# Patient Record
Sex: Female | Born: 1985
Health system: Southern US, Community
[De-identification: ages and names within clinical notes are randomized; demographics above are authoritative.]

## PROBLEM LIST (undated history)

## (undated) DIAGNOSIS — Z789 Other specified health status: Secondary | ICD-10-CM

## (undated) HISTORY — DX: Other specified health status: Z78.9

## (undated) HISTORY — PX: NO PAST SURGERIES: SHX2092

## (undated) HISTORY — PX: WISDOM TOOTH EXTRACTION: SHX21

---

## 2009-03-15 ENCOUNTER — Inpatient Hospital Stay (HOSPITAL_COMMUNITY): Admission: AD | Admit: 2009-03-15 | Discharge: 2009-03-17 | Payer: Self-pay | Admitting: Obstetrics & Gynecology

## 2010-05-17 LAB — CBC
HCT: 29.5 % — ABNORMAL LOW (ref 36.0–46.0)
HCT: 35.7 % — ABNORMAL LOW (ref 36.0–46.0)
Hemoglobin: 9.8 g/dL — ABNORMAL LOW (ref 12.0–15.0)
MCV: 91.8 fL (ref 78.0–100.0)
RBC: 3.89 MIL/uL (ref 3.87–5.11)
RDW: 15.3 % (ref 11.5–15.5)
WBC: 15.8 10*3/uL — ABNORMAL HIGH (ref 4.0–10.5)

## 2010-05-17 LAB — RPR: RPR Ser Ql: NONREACTIVE

## 2011-03-01 NOTE — L&D Delivery Note (Addendum)
Delivery Note At 12:13 AM a viable and healthy female was spontaneously delivered, LOA.  APGAR: 9,9; weight 8 lbs 5 oz.   Placenta status: spontaneous, intact.    Anesthesia: Epidural  Episiotomy: None Lacerations: None (Cervix inspected, Uterus explored). Est. Blood Loss (mL): 300  Mom to postpartum.  Baby to nursery-stable.  Jadarious Dobbins D 01/24/2012, 12:24 AM

## 2011-05-24 LAB — OB RESULTS CONSOLE ABO/RH: RH Type: POSITIVE

## 2011-05-24 LAB — OB RESULTS CONSOLE HIV ANTIBODY (ROUTINE TESTING): HIV: NONREACTIVE

## 2011-05-24 LAB — OB RESULTS CONSOLE GC/CHLAMYDIA: Chlamydia: NEGATIVE

## 2011-05-24 LAB — OB RESULTS CONSOLE ANTIBODY SCREEN: Antibody Screen: NEGATIVE

## 2011-05-24 LAB — OB RESULTS CONSOLE RUBELLA ANTIBODY, IGM: Rubella: IMMUNE

## 2012-01-23 ENCOUNTER — Encounter (HOSPITAL_COMMUNITY): Payer: Self-pay | Admitting: *Deleted

## 2012-01-23 ENCOUNTER — Inpatient Hospital Stay (HOSPITAL_COMMUNITY)
Admission: AD | Admit: 2012-01-23 | Discharge: 2012-01-23 | Disposition: A | Discharge status: Home / Self Care | Payer: 59 | Source: Ambulatory Visit | Attending: Obstetrics & Gynecology | Admitting: Obstetrics & Gynecology

## 2012-01-23 ENCOUNTER — Inpatient Hospital Stay (HOSPITAL_COMMUNITY): Payer: 59 | Admitting: Anesthesiology

## 2012-01-23 ENCOUNTER — Inpatient Hospital Stay (HOSPITAL_COMMUNITY)
Admission: AD | Admit: 2012-01-23 | Discharge: 2012-01-25 | DRG: 775 | Disposition: A | Discharge status: Home / Self Care | Payer: 59 | Source: Ambulatory Visit | Attending: Obstetrics & Gynecology | Admitting: Obstetrics & Gynecology

## 2012-01-23 ENCOUNTER — Encounter (HOSPITAL_COMMUNITY): Payer: Self-pay | Admitting: Anesthesiology

## 2012-01-23 DIAGNOSIS — O479 False labor, unspecified: Secondary | ICD-10-CM | POA: Insufficient documentation

## 2012-01-23 HISTORY — DX: Other specified health status: Z78.9

## 2012-01-23 LAB — CBC
HCT: 36.1 % (ref 36.0–46.0)
MCH: 30.5 pg (ref 26.0–34.0)
MCV: 88.7 fL (ref 78.0–100.0)
Platelets: 237 10*3/uL (ref 150–400)
RBC: 4.07 MIL/uL (ref 3.87–5.11)
WBC: 14.7 10*3/uL — ABNORMAL HIGH (ref 4.0–10.5)

## 2012-01-23 MED ORDER — BUTORPHANOL TARTRATE 1 MG/ML IJ SOLN: 1.0000 mg | INTRAMUSCULAR | Status: DC | PRN

## 2012-01-23 MED ORDER — LIDOCAINE HCL (PF) 1 % IJ SOLN
INTRAMUSCULAR | Status: DC | PRN
  Administered 2012-01-23 (×2): 5 mL

## 2012-01-23 MED ORDER — EPHEDRINE 5 MG/ML INJ
10.0000 mg | INTRAVENOUS | Status: DC | PRN
  Filled 2012-01-23: qty 4

## 2012-01-23 MED ORDER — PHENYLEPHRINE 40 MCG/ML (10ML) SYRINGE FOR IV PUSH (FOR BLOOD PRESSURE SUPPORT): 80.0000 ug | PREFILLED_SYRINGE | INTRAVENOUS | Status: DC | PRN

## 2012-01-23 MED ORDER — LACTATED RINGERS IV SOLN: 500.0000 mL | INTRAVENOUS | Status: DC | PRN

## 2012-01-23 MED ORDER — PHENYLEPHRINE 40 MCG/ML (10ML) SYRINGE FOR IV PUSH (FOR BLOOD PRESSURE SUPPORT)
80.0000 ug | PREFILLED_SYRINGE | INTRAVENOUS | Status: DC | PRN
  Filled 2012-01-23: qty 5

## 2012-01-23 MED ORDER — CITRIC ACID-SODIUM CITRATE 334-500 MG/5ML PO SOLN: 30.0000 mL | ORAL | Status: DC | PRN

## 2012-01-23 MED ORDER — FENTANYL 2.5 MCG/ML BUPIVACAINE 1/10 % EPIDURAL INFUSION (WH - ANES)
14.0000 mL/h | INTRAMUSCULAR | Status: DC
  Administered 2012-01-23: 14 mL/h via EPIDURAL
  Filled 2012-01-23: qty 125

## 2012-01-23 MED ORDER — OXYTOCIN BOLUS FROM INFUSION: 500.0000 mL | INTRAVENOUS | Status: DC

## 2012-01-23 MED ORDER — DIPHENHYDRAMINE HCL 50 MG/ML IJ SOLN: 12.5000 mg | INTRAMUSCULAR | Status: DC | PRN

## 2012-01-23 MED ORDER — OXYTOCIN 40 UNITS IN LACTATED RINGERS INFUSION - SIMPLE MED
62.5000 mL/h | INTRAVENOUS | Status: DC
  Administered 2012-01-24: 999 mL/h via INTRAVENOUS
  Filled 2012-01-23: qty 1000

## 2012-01-23 MED ORDER — LACTATED RINGERS IV SOLN
INTRAVENOUS | Status: DC
  Administered 2012-01-23: 20:00:00 via INTRAVENOUS

## 2012-01-23 MED ORDER — OXYCODONE-ACETAMINOPHEN 5-325 MG PO TABS: 1.0000 | ORAL_TABLET | ORAL | Status: DC | PRN

## 2012-01-23 MED ORDER — ACETAMINOPHEN 325 MG PO TABS: 650.0000 mg | ORAL_TABLET | ORAL | Status: DC | PRN

## 2012-01-23 MED ORDER — EPHEDRINE 5 MG/ML INJ: 10.0000 mg | INTRAVENOUS | Status: DC | PRN

## 2012-01-23 MED ORDER — IBUPROFEN 600 MG PO TABS: 600.0000 mg | ORAL_TABLET | Freq: Four times a day (QID) | ORAL | Status: DC | PRN

## 2012-01-23 MED ORDER — LACTATED RINGERS IV SOLN
500.0000 mL | Freq: Once | INTRAVENOUS | Status: AC
  Administered 2012-01-23: 500 mL via INTRAVENOUS

## 2012-01-23 MED ORDER — LIDOCAINE HCL (PF) 1 % IJ SOLN
30.0000 mL | INTRAMUSCULAR | Status: DC | PRN
  Filled 2012-01-23: qty 30

## 2012-01-23 MED ORDER — ONDANSETRON HCL 4 MG/2ML IJ SOLN: 4.0000 mg | Freq: Four times a day (QID) | INTRAMUSCULAR | Status: DC | PRN

## 2012-01-23 NOTE — MAU Note (Signed)
Been contracting all day, getting closer and stronger. Now every 5 min.

## 2012-01-23 NOTE — MAU Note (Signed)
C/o ucs for past 2 days but ucs are stronger today;

## 2012-01-23 NOTE — Anesthesia Procedure Notes (Signed)
Epidural Patient location during procedure: OB Start time: 01/23/2012 8:15 PM  Staffing Anesthesiologist: Brayton Caves R Performed by: anesthesiologist   Preanesthetic Checklist Completed: patient identified, site marked, surgical consent, pre-op evaluation, timeout performed, IV checked, risks and benefits discussed and monitors and equipment checked  Epidural Patient position: sitting Prep: site prepped and draped and DuraPrep Patient monitoring: continuous pulse ox and blood pressure Approach: midline Injection technique: LOR air and LOR saline  Needle:  Needle type: Tuohy  Needle gauge: 17 G Needle length: 9 cm and 9 Needle insertion depth: 5 cm cm Catheter type: closed end flexible Catheter size: 19 Gauge Catheter at skin depth: 10 cm Test dose: negative  Assessment Events: blood not aspirated, injection not painful, no injection resistance, negative IV test and no paresthesia  Additional Notes Patient identified.  Risk benefits discussed including failed block, incomplete pain control, headache, nerve damage, paralysis, blood pressure changes, nausea, vomiting, reactions to medication both toxic or allergic, and postpartum back pain.  Patient expressed understanding and wished to proceed.  All questions were answered.  Sterile technique used throughout procedure and epidural site dressed with sterile barrier dressing. No paresthesia or other complications noted.The patient did not experience any signs of intravascular injection such as tinnitus or metallic taste in mouth nor signs of intrathecal spread such as rapid motor block. Please see nursing notes for vital signs.

## 2012-01-23 NOTE — H&P (Signed)
26 y.o. G2P1001  Estimated Date of Delivery: 01/19/12 admitted at 40/[redacted] weeks gestation in early labor.  Prenatal Transfer Tool  Maternal Diabetes: No Genetic Screening: Declined Maternal Ultrasounds/Referrals: Normal Fetal Ultrasounds or other Referrals:  None Maternal Substance Abuse:  No Significant Maternal Medications:  None Significant Maternal Lab Results: None Other Significant Pregnancy Complications:  None  Afebrile, VSS Heart and Lungs: No active disease Abdomen: soft, gravid, EFW AGA. Cervical exam:  4/80  Impression: Early labor  Plan:  Admit for delivery

## 2012-01-23 NOTE — Anesthesia Preprocedure Evaluation (Signed)

## 2012-01-23 NOTE — MAU Note (Signed)
Notified Dr. Arlyce Dice patient G2P1 [redacted]w[redacted]d contractions every 3-4 minutes, 4/80/-1 intact, GBS negative, uncomfortable desires epidural.

## 2012-01-24 ENCOUNTER — Encounter (HOSPITAL_COMMUNITY): Payer: Self-pay | Admitting: *Deleted

## 2012-01-24 MED ORDER — BENZOCAINE-MENTHOL 20-0.5 % EX AERO: 1.0000 "application " | INHALATION_SPRAY | CUTANEOUS | Status: DC | PRN

## 2012-01-24 MED ORDER — LANOLIN HYDROUS EX OINT: TOPICAL_OINTMENT | CUTANEOUS | Status: DC | PRN

## 2012-01-24 MED ORDER — OXYCODONE-ACETAMINOPHEN 5-325 MG PO TABS
1.0000 | ORAL_TABLET | ORAL | Status: DC | PRN
  Administered 2012-01-24 (×2): 1 via ORAL
  Filled 2012-01-24 (×2): qty 1

## 2012-01-24 MED ORDER — ZOLPIDEM TARTRATE 5 MG PO TABS: 5.0000 mg | ORAL_TABLET | Freq: Every evening | ORAL | Status: DC | PRN

## 2012-01-24 MED ORDER — ONDANSETRON HCL 4 MG/2ML IJ SOLN: 4.0000 mg | INTRAMUSCULAR | Status: DC | PRN

## 2012-01-24 MED ORDER — IBUPROFEN 600 MG PO TABS
600.0000 mg | ORAL_TABLET | Freq: Four times a day (QID) | ORAL | Status: DC
  Administered 2012-01-24 – 2012-01-25 (×6): 600 mg via ORAL
  Filled 2012-01-24 (×6): qty 1

## 2012-01-24 MED ORDER — PRENATAL MULTIVITAMIN CH
1.0000 | ORAL_TABLET | Freq: Every day | ORAL | Status: DC
  Administered 2012-01-24 – 2012-01-25 (×2): 1 via ORAL
  Filled 2012-01-24 (×2): qty 1

## 2012-01-24 MED ORDER — DIBUCAINE 1 % RE OINT: 1.0000 "application " | TOPICAL_OINTMENT | RECTAL | Status: DC | PRN

## 2012-01-24 MED ORDER — ONDANSETRON HCL 4 MG PO TABS: 4.0000 mg | ORAL_TABLET | ORAL | Status: DC | PRN

## 2012-01-24 MED ORDER — TETANUS-DIPHTH-ACELL PERTUSSIS 5-2.5-18.5 LF-MCG/0.5 IM SUSP
0.5000 mL | Freq: Once | INTRAMUSCULAR | Status: AC
  Administered 2012-01-25: 0.5 mL via INTRAMUSCULAR
  Filled 2012-01-24: qty 0.5

## 2012-01-24 MED ORDER — SIMETHICONE 80 MG PO CHEW: 80.0000 mg | CHEWABLE_TABLET | ORAL | Status: DC | PRN

## 2012-01-24 MED ORDER — SENNOSIDES-DOCUSATE SODIUM 8.6-50 MG PO TABS
2.0000 | ORAL_TABLET | Freq: Every day | ORAL | Status: DC
  Administered 2012-01-25: 2 via ORAL

## 2012-01-24 MED ORDER — DIPHENHYDRAMINE HCL 25 MG PO CAPS: 25.0000 mg | ORAL_CAPSULE | Freq: Four times a day (QID) | ORAL | Status: DC | PRN

## 2012-01-24 MED ORDER — WITCH HAZEL-GLYCERIN EX PADS: 1.0000 "application " | MEDICATED_PAD | CUTANEOUS | Status: DC | PRN

## 2012-01-24 NOTE — Progress Notes (Signed)
Post Partum Day 0 Subjective: no complaints, up ad lib, voiding and tolerating PO  Objective: Blood pressure 115/75, pulse 91, temperature 97.9 F (36.6 C), temperature source Oral, resp. rate 18, SpO2 100.00%, unknown if currently breastfeeding.  Physical Exam:  General: alert, cooperative and appears stated age Lochia: appropriate Uterine Fundus: firm   Basename 01/23/12 1930  HGB 12.4  HCT 36.1    Assessment/Plan: Routine PP care   LOS: 1 day   Dana Debo H. 01/24/2012, 10:20 AM

## 2012-01-24 NOTE — Anesthesia Postprocedure Evaluation (Signed)
  Anesthesia Post-op Note  Patient: Teresa Bean  Procedure(s) Performed: * No procedures listed *  Patient Location: Mother/Baby  Anesthesia Type:Epidural  Level of Consciousness: awake, alert  and oriented  Airway and Oxygen Therapy: Patient Spontanous Breathing  Post-op Pain: none  Post-op Assessment: Post-op Vital signs reviewed, Patient's Cardiovascular Status Stable, No headache, No backache, No residual numbness and No residual motor weakness  Post-op Vital Signs: Reviewed and stable  Complications: No apparent anesthesia complications

## 2012-01-25 LAB — CBC
HCT: 31.1 % — ABNORMAL LOW (ref 36.0–46.0)
MCH: 29.6 pg (ref 26.0–34.0)
MCV: 90.1 fL (ref 78.0–100.0)
Platelets: 189 10*3/uL (ref 150–400)
RBC: 3.45 MIL/uL — ABNORMAL LOW (ref 3.87–5.11)
RDW: 14.9 % (ref 11.5–15.5)

## 2012-01-25 NOTE — Discharge Summary (Signed)
Obstetric Discharge Summary Reason for Admission: onset of labor Prenatal Procedures: none Intrapartum Procedures: spontaneous vaginal delivery Postpartum Procedures: none Complications-Operative and Postpartum: none Hemoglobin  Date Value Range Status  01/25/2012 10.2* 12.0 - 15.0 g/dL Final     DELTA CHECK NOTED     REPEATED TO VERIFY     HCT  Date Value Range Status  01/25/2012 31.1* 36.0 - 46.0 % Final     Discharge Diagnoses: Term Pregnancy-delivered  Discharge Information: Date: 01/25/2012 Activity: pelvic rest Diet: routine Medications: Ibuprofen Condition: stable Instructions: refer to practice specific booklet Discharge to: home Follow-up Information    Follow up with Mickel Baas, MD. Schedule an appointment as soon as possible for a visit in 4 weeks.   Contact information:   [address] [place] [address] [phone]          Newborn Data: Live born female  Birth Weight: 8 lb 4.8 oz (3765 g) APGAR: 8, 9  Home with mother.  Byron Tipping A 01/25/2012, 8:48 AM

## 2012-01-25 NOTE — Progress Notes (Addendum)
Patient is eating, ambulating, voiding.  Pain control is good.  Filed Vitals:   01/24/12 0614 01/24/12 1435 01/24/12 2127 01/25/12 0555  BP: 115/75 114/75 109/73 107/67  Pulse: 91 84 75 86  Temp: 97.9 F (36.6 C) 98 F (36.7 C) 97.9 F (36.6 C) 98.2 F (36.8 C)  TempSrc: Oral Oral Oral Oral  Resp: 18 18 18 18   SpO2: 100%       Fundus firm Perineum without swelling.  Lab Results  Component Value Date   WBC 13.4* 01/25/2012   HGB 10.2* 01/25/2012   HCT 31.1* 01/25/2012   MCV 90.1 01/25/2012   PLT 189 01/25/2012    --/--/O POS (11/25 1930)/RI  A/P Post partum day 1.  Routine care. Pt desires d/c today.    Glennie Rodda A

## 2012-02-07 ENCOUNTER — Telehealth (HOSPITAL_COMMUNITY): Payer: Self-pay | Admitting: *Deleted

## 2012-02-07 NOTE — Telephone Encounter (Signed)
Resolve episode 

## 2013-12-30 ENCOUNTER — Encounter (HOSPITAL_COMMUNITY): Payer: Self-pay | Admitting: *Deleted

## 2015-05-01 MED FILL — metroNIDAZOLE 500 MG TABS: 500 | 7 days supply | Qty: 14 | Fill #0

## 2015-06-03 DIAGNOSIS — Z01419 Encounter for gynecological examination (general) (routine) without abnormal findings: Secondary | ICD-10-CM | POA: Diagnosis not present

## 2015-06-03 DIAGNOSIS — Z124 Encounter for screening for malignant neoplasm of cervix: Secondary | ICD-10-CM | POA: Diagnosis not present

## 2015-06-03 DIAGNOSIS — Z6829 Body mass index (BMI) 29.0-29.9, adult: Secondary | ICD-10-CM | POA: Diagnosis not present

## 2015-07-02 MED FILL — NUVARING VAGINAL RING: 0.12-0.015 | 84 days supply | Qty: 3 | Fill #0

## 2015-09-09 MED FILL — NUVARING VAGINAL RING: 0.12-0.015 | 28 days supply | Qty: 1 | Fill #1

## 2015-10-27 MED FILL — NUVARING VAGINAL RING: 0.12-0.015 | 84 days supply | Qty: 3 | Fill #0

## 2016-03-07 MED FILL — NUVARING VAGINAL RING: 0.12-0.015 | 84 days supply | Qty: 3 | Fill #1

## 2016-10-19 MED FILL — NUVARING VAGINAL RING: 0.12-0.015 | 84 days supply | Qty: 3 | Fill #0

## 2017-02-27 MED FILL — NUVARING VAGINAL RING: 0.12-0.015 | 84 days supply | Qty: 3 | Fill #1

## 2017-06-08 DIAGNOSIS — Z124 Encounter for screening for malignant neoplasm of cervix: Secondary | ICD-10-CM | POA: Diagnosis not present

## 2017-06-08 DIAGNOSIS — Z113 Encounter for screening for infections with a predominantly sexual mode of transmission: Secondary | ICD-10-CM | POA: Diagnosis not present

## 2017-06-08 DIAGNOSIS — Z01419 Encounter for gynecological examination (general) (routine) without abnormal findings: Secondary | ICD-10-CM | POA: Diagnosis not present

## 2017-07-28 DIAGNOSIS — H5213 Myopia, bilateral: Secondary | ICD-10-CM | POA: Diagnosis not present

## 2017-07-28 DIAGNOSIS — H52223 Regular astigmatism, bilateral: Secondary | ICD-10-CM | POA: Diagnosis not present

## 2018-08-17 DIAGNOSIS — Z3040 Encounter for surveillance of contraceptives, unspecified: Secondary | ICD-10-CM | POA: Diagnosis not present

## 2018-08-23 DIAGNOSIS — H5213 Myopia, bilateral: Secondary | ICD-10-CM | POA: Diagnosis not present

## 2018-09-07 MED FILL — ETONOGESTREL-ETHINYL ESTRAD: 0.12-0.015 | 84 days supply | Qty: 3 | Fill #0

## 2018-11-22 DIAGNOSIS — Z0000 Encounter for general adult medical examination without abnormal findings: Secondary | ICD-10-CM | POA: Diagnosis not present

## 2018-11-22 DIAGNOSIS — Z131 Encounter for screening for diabetes mellitus: Secondary | ICD-10-CM | POA: Diagnosis not present

## 2018-11-22 DIAGNOSIS — Z130 Encounter for screening for diseases of the blood and blood-forming organs and certain disorders involving the immune mechanism: Secondary | ICD-10-CM | POA: Diagnosis not present

## 2018-11-22 DIAGNOSIS — Z01419 Encounter for gynecological examination (general) (routine) without abnormal findings: Secondary | ICD-10-CM | POA: Diagnosis not present

## 2018-11-22 DIAGNOSIS — Z6830 Body mass index (BMI) 30.0-30.9, adult: Secondary | ICD-10-CM | POA: Diagnosis not present

## 2018-11-22 DIAGNOSIS — Z13220 Encounter for screening for lipoid disorders: Secondary | ICD-10-CM | POA: Diagnosis not present

## 2018-11-22 DIAGNOSIS — N898 Other specified noninflammatory disorders of vagina: Secondary | ICD-10-CM | POA: Diagnosis not present

## 2018-11-22 DIAGNOSIS — Z1159 Encounter for screening for other viral diseases: Secondary | ICD-10-CM | POA: Diagnosis not present

## 2018-11-22 DIAGNOSIS — Z113 Encounter for screening for infections with a predominantly sexual mode of transmission: Secondary | ICD-10-CM | POA: Diagnosis not present

## 2018-11-22 DIAGNOSIS — Z1329 Encounter for screening for other suspected endocrine disorder: Secondary | ICD-10-CM | POA: Diagnosis not present

## 2018-11-22 DIAGNOSIS — Z1151 Encounter for screening for human papillomavirus (HPV): Secondary | ICD-10-CM | POA: Diagnosis not present

## 2018-11-22 DIAGNOSIS — Z118 Encounter for screening for other infectious and parasitic diseases: Secondary | ICD-10-CM | POA: Diagnosis not present

## 2018-11-22 DIAGNOSIS — Z114 Encounter for screening for human immunodeficiency virus [HIV]: Secondary | ICD-10-CM | POA: Diagnosis not present

## 2018-11-22 MED FILL — FLUCONAZOLE 150 MG TABS: 150 | 1 days supply | Qty: 1 | Fill #0

## 2018-11-22 MED FILL — NYSTATIN-TRIAMCINOLONE CRM: 100000-0.1 | 10 days supply | Qty: 30 | Fill #0

## 2018-12-05 ENCOUNTER — Ambulatory Visit (INDEPENDENT_AMBULATORY_CARE_PROVIDER_SITE_OTHER): Payer: 59 | Admitting: Family Medicine

## 2018-12-05 ENCOUNTER — Other Ambulatory Visit: Payer: Self-pay

## 2018-12-05 ENCOUNTER — Encounter: Payer: Self-pay | Admitting: Family Medicine

## 2018-12-05 VITALS — BP 113/75 | HR 83 | Temp 99.3°F | Resp 17 | Ht 64.0 in | Wt 174.8 lb

## 2018-12-05 DIAGNOSIS — Z131 Encounter for screening for diabetes mellitus: Secondary | ICD-10-CM | POA: Diagnosis not present

## 2018-12-05 DIAGNOSIS — Z30016 Encounter for initial prescription of transdermal patch hormonal contraceptive device: Secondary | ICD-10-CM

## 2018-12-05 DIAGNOSIS — Z0000 Encounter for general adult medical examination without abnormal findings: Secondary | ICD-10-CM

## 2018-12-05 DIAGNOSIS — Z13220 Encounter for screening for lipoid disorders: Secondary | ICD-10-CM | POA: Diagnosis not present

## 2018-12-05 DIAGNOSIS — Z1329 Encounter for screening for other suspected endocrine disorder: Secondary | ICD-10-CM

## 2018-12-05 DIAGNOSIS — Z23 Encounter for immunization: Secondary | ICD-10-CM

## 2018-12-05 DIAGNOSIS — Z136 Encounter for screening for cardiovascular disorders: Secondary | ICD-10-CM

## 2018-12-05 LAB — POCT URINE PREGNANCY: Preg Test, Ur: NEGATIVE

## 2018-12-05 MED ORDER — XULANE 150-35 MCG/24HR TD PTWK: 1.0000 | MEDICATED_PATCH | TRANSDERMAL | 12 refills | Status: DC

## 2018-12-05 MED FILL — XULANE PATCH: 150-35 | 28 days supply | Qty: 3 | Fill #0

## 2018-12-05 NOTE — Patient Instructions (Signed)
° ° ° °  If you have lab work done today you will be contacted with your lab results within the next 2 weeks.  If you have not heard from us then please contact us. The fastest way to get your results is to register for My Chart. ° ° °IF you received an x-ray today, you will receive an invoice from [facility]. Please contact [facility] at [phone] with questions or concerns regarding your invoice.  ° °IF you received labwork today, you will receive an invoice from [facility]. Please contact [facility] at [phone] with questions or concerns regarding your invoice.  ° °Our billing staff will not be able to assist you with questions regarding bills from these companies. ° °You will be contacted with the lab results as soon as they are available. The fastest way to get your results is to activate your My Chart account. Instructions are located on the last page of this paperwork. If you have not heard from us regarding the results in 2 weeks, please contact this office. °  ° ° ° °

## 2018-12-05 NOTE — Progress Notes (Signed)
New Patient Office Visit  Subjective:  Patient ID: Teresa Bean, female    DOB: 1985-08-11  Age: 33 y.o. MRN: [id number]  CC:  Chief Complaint  Patient presents with  . New Patient (Initial Visit)    establish care    HPI Teresa Bean presents for   She reports that she needs a new version of contraception Her insurance will not cover the brand nuvaring and will only cover the generic.  She has been on the nuvaring for 9 years. The generic makes her nauseous. Dr. Harvie Bridge gave her a prescription of nuvaring but insurance won't cover. She is currently abstinent.  No history of migraines, liver disease, hypertension,   G2P2002  She does not desire any pregnancy    Past Medical History:  Diagnosis Date  . No pertinent past medical history     Past Surgical History:  Procedure Laterality Date  . NO PAST SURGERIES      Family History  Problem Relation Age of Onset  . Other Neg Hx     Social History   Socioeconomic History  . Marital status: Single    Spouse name: Not on file  . Number of children: Not on file  . Years of education: Not on file  . Highest education level: Not on file  Occupational History  . Not on file  Social Needs  . Financial resource strain: Not on file  . Food insecurity    Worry: Not on file    Inability: Not on file  . Transportation needs    Medical: Not on file    Non-medical: Not on file  Tobacco Use  . Smoking status: Never Smoker  . Smokeless tobacco: Never Used  Substance and Sexual Activity  . Alcohol use: No  . Drug use: No  . Sexual activity: Yes  Lifestyle  . Physical activity    Days per week: Not on file    Minutes per session: Not on file  . Stress: Not on file  Relationships  . Social Herbalist on phone: Not on file    Gets together: Not on file    Attends religious service: Not on file    Active member of club or organization: Not on file    Attends meetings of clubs or  organizations: Not on file    Relationship status: Not on file  . Intimate partner violence    Fear of current or ex partner: Not on file    Emotionally abused: Not on file    Physically abused: Not on file    Forced sexual activity: Not on file  Other Topics Concern  . Not on file  Social History Narrative  . Not on file    ROS Review of Systems Review of Systems  Constitutional: Negative for activity change, appetite change, chills and fever.  HENT: Negative for congestion, nosebleeds, trouble swallowing and voice change.   Respiratory: Negative for cough, shortness of breath and wheezing.   Gastrointestinal: Negative for diarrhea, nausea and vomiting.  Genitourinary: Negative for difficulty urinating, dysuria, flank pain and hematuria.  Musculoskeletal: Negative for back pain, joint swelling and neck pain.  Neurological: Negative for dizziness, speech difficulty, light-headedness and numbness.  See HPI. All other review of systems negative.   Objective:   Today's Vitals: BP 113/75 (BP Location: Right Arm, Patient Position: Sitting, Cuff Size: Normal)   Pulse 83   Temp 99.3 F (37.4 C) (Oral)   Resp 17  Ht 5\' 4"  (1.626 m)   Wt 174 lb 12.8 oz (79.3 kg)   SpO2 99%   BMI 30.00 kg/m   Physical Exam Constitutional:      Appearance: Normal appearance. She is normal weight.  HENT:     Head: Normocephalic and atraumatic.     Nose: Nose normal.     Mouth/Throat:     Pharynx: No oropharyngeal exudate.  Eyes:     Extraocular Movements: Extraocular movements intact.     Conjunctiva/sclera: Conjunctivae normal.  Neck:     Musculoskeletal: Normal range of motion and neck supple. No neck rigidity.  Cardiovascular:     Rate and Rhythm: Normal rate and regular rhythm.     Pulses: Normal pulses.     Heart sounds: No murmur.  Pulmonary:     Effort: Pulmonary effort is normal. No respiratory distress.     Breath sounds: Normal breath sounds. No stridor. No wheezing, rhonchi or  rales.  Chest:     Chest wall: No tenderness.  Abdominal:     General: Abdomen is flat. Bowel sounds are normal. There is no distension.     Palpations: Abdomen is soft. There is no mass.     Tenderness: There is no abdominal tenderness. There is no guarding or rebound.     Hernia: No hernia is present.  Skin:    General: Skin is warm.     Capillary Refill: Capillary refill takes less than 2 seconds.  Neurological:     General: No focal deficit present.     Mental Status: She is alert and oriented to person, place, and time.  Psychiatric:        Mood and Affect: Mood normal.        Behavior: Behavior normal.        Thought Content: Thought content normal.        Judgment: Judgment normal.       Assessment & Plan:   Problem List Items Addressed This Visit    None    Visit Diagnoses    Routine health maintenance    -  Primary Women's Health Maintenance Plan Advised monthly breast exam and annual mammogram Advised dental exam every six months Discussed stress management Discussed pap smear screening guidelines     Need for prophylactic vaccination and inoculation against influenza       Encounter for initial prescription of transdermal patch hormonal contraceptive device     Discussed options for contraception, discussed frequency of use and common side effects Will use patch   Relevant Medications   norelgestromin-ethinyl estradiol Marilu Favre) 150-35 MCG/24HR transdermal patch   Other Relevant Orders   POCT urine pregnancy (Completed)   Screening for thyroid disorder       Relevant Orders   TSH   Encounter for lipid screening for cardiovascular disease       Relevant Orders   Lipid panel   Screening for diabetes mellitus          Outpatient Encounter Medications as of 12/05/2018  Medication Sig  . acetaminophen (TYLENOL) 500 MG tablet Take 500 mg by mouth daily as needed. For headaches/pain  . calcium carbonate (TUMS - DOSED IN MG ELEMENTAL CALCIUM) 500 MG  chewable tablet Chew 2 tablets by mouth at bedtime as needed. For heartburn  . norelgestromin-ethinyl estradiol Marilu Favre) 150-35 MCG/24HR transdermal patch Place 1 patch onto the skin once a week. Dispense as Marilu Favre.  . Prenatal Vit-Fe Fumarate-FA (PRENATAL MULTIVITAMIN) TABS Take 1 tablet by mouth daily.  No facility-administered encounter medications on file as of 12/05/2018.     Follow-up: No follow-ups on file.   Forrest Moron, MD

## 2018-12-06 LAB — LIPID PANEL
Chol/HDL Ratio: 3 ratio (ref 0.0–4.4)
Cholesterol, Total: 204 mg/dL — ABNORMAL HIGH (ref 100–199)
HDL: 68 mg/dL (ref >39)
LDL Chol Calc (NIH): 128 mg/dL — ABNORMAL HIGH (ref 0–99)
Triglycerides: 43 mg/dL (ref 0–149)
VLDL Cholesterol Cal: 8 mg/dL (ref 5–40)

## 2018-12-06 LAB — TSH: TSH: 0.97 u[IU]/mL (ref 0.450–4.500)

## 2018-12-13 ENCOUNTER — Encounter: Payer: Self-pay | Admitting: Radiology

## 2019-02-18 ENCOUNTER — Other Ambulatory Visit: Payer: Self-pay | Admitting: Cardiology

## 2019-02-18 DIAGNOSIS — Z20828 Contact with and (suspected) exposure to other viral communicable diseases: Secondary | ICD-10-CM | POA: Diagnosis not present

## 2019-02-18 DIAGNOSIS — Z20822 Contact with and (suspected) exposure to covid-19: Secondary | ICD-10-CM

## 2019-02-19 LAB — NOVEL CORONAVIRUS, NAA: SARS-CoV-2, NAA: NOT DETECTED

## 2019-08-23 DIAGNOSIS — H52223 Regular astigmatism, bilateral: Secondary | ICD-10-CM | POA: Diagnosis not present

## 2019-08-23 DIAGNOSIS — H04123 Dry eye syndrome of bilateral lacrimal glands: Secondary | ICD-10-CM | POA: Diagnosis not present

## 2019-08-23 DIAGNOSIS — H5213 Myopia, bilateral: Secondary | ICD-10-CM | POA: Diagnosis not present

## 2019-09-16 ENCOUNTER — Other Ambulatory Visit: Payer: Self-pay

## 2019-09-16 ENCOUNTER — Ambulatory Visit: Payer: 59 | Attending: Internal Medicine

## 2019-09-16 DIAGNOSIS — Z20822 Contact with and (suspected) exposure to covid-19: Secondary | ICD-10-CM | POA: Insufficient documentation

## 2019-09-17 LAB — SARS-COV-2, NAA 2 DAY TAT

## 2019-09-17 LAB — NOVEL CORONAVIRUS, NAA: SARS-CoV-2, NAA: NOT DETECTED

## 2020-05-19 DIAGNOSIS — Z309 Encounter for contraceptive management, unspecified: Secondary | ICD-10-CM | POA: Diagnosis not present

## 2020-05-19 DIAGNOSIS — Z01419 Encounter for gynecological examination (general) (routine) without abnormal findings: Secondary | ICD-10-CM | POA: Diagnosis not present

## 2020-05-21 DIAGNOSIS — Z131 Encounter for screening for diabetes mellitus: Secondary | ICD-10-CM | POA: Diagnosis not present

## 2020-06-03 ENCOUNTER — Other Ambulatory Visit (HOSPITAL_BASED_OUTPATIENT_CLINIC_OR_DEPARTMENT_OTHER): Payer: Self-pay

## 2020-06-03 MED ORDER — IBUPROFEN 800 MG PO TABS
ORAL_TABLET | ORAL | 0 refills | Status: DC
Start: 2020-06-03 — End: 2021-04-08
  Filled 2020-06-03: qty 21, 7d supply, fill #0

## 2020-06-11 DIAGNOSIS — Z30430 Encounter for insertion of intrauterine contraceptive device: Secondary | ICD-10-CM | POA: Diagnosis not present

## 2020-06-11 DIAGNOSIS — Z309 Encounter for contraceptive management, unspecified: Secondary | ICD-10-CM | POA: Diagnosis not present

## 2020-07-09 DIAGNOSIS — N930 Postcoital and contact bleeding: Secondary | ICD-10-CM | POA: Diagnosis not present

## 2020-07-09 DIAGNOSIS — Z1159 Encounter for screening for other viral diseases: Secondary | ICD-10-CM | POA: Diagnosis not present

## 2020-07-09 DIAGNOSIS — Z113 Encounter for screening for infections with a predominantly sexual mode of transmission: Secondary | ICD-10-CM | POA: Diagnosis not present

## 2020-07-09 DIAGNOSIS — Z114 Encounter for screening for human immunodeficiency virus [HIV]: Secondary | ICD-10-CM | POA: Diagnosis not present

## 2020-07-09 DIAGNOSIS — Z30431 Encounter for routine checking of intrauterine contraceptive device: Secondary | ICD-10-CM | POA: Diagnosis not present

## 2020-07-21 ENCOUNTER — Ambulatory Visit: Payer: 59 | Attending: Internal Medicine

## 2020-07-21 ENCOUNTER — Other Ambulatory Visit (HOSPITAL_BASED_OUTPATIENT_CLINIC_OR_DEPARTMENT_OTHER): Payer: Self-pay

## 2020-07-21 DIAGNOSIS — N760 Acute vaginitis: Secondary | ICD-10-CM | POA: Diagnosis not present

## 2020-07-21 DIAGNOSIS — Z113 Encounter for screening for infections with a predominantly sexual mode of transmission: Secondary | ICD-10-CM | POA: Diagnosis not present

## 2020-07-21 DIAGNOSIS — N898 Other specified noninflammatory disorders of vagina: Secondary | ICD-10-CM | POA: Diagnosis not present

## 2020-07-21 DIAGNOSIS — Z23 Encounter for immunization: Secondary | ICD-10-CM

## 2020-07-21 MED ORDER — FLUCONAZOLE 150 MG PO TABS
ORAL_TABLET | ORAL | 1 refills | Status: DC
  Filled 2020-07-21: qty 1, 1d supply, fill #0

## 2020-07-21 MED ORDER — METRONIDAZOLE 500 MG PO TABS
ORAL_TABLET | ORAL | 1 refills | Status: DC
  Filled 2020-07-21: qty 14, 7d supply, fill #0
  Filled 2020-09-07: qty 14, 7d supply, fill #1

## 2020-07-21 NOTE — Progress Notes (Signed)
   Covid-19 Vaccination Clinic  Name:  Teresa Bean    MRN: [id number] DOB: December 25, 1985  07/21/2020  [name] was observed post Covid-19 immunization for 15 minutes without incident. She was provided with Vaccine Information Sheet and instruction to access the V-Safe system.   [name] was instructed to call 911 with any severe reactions post vaccine: Marland Kitchen Difficulty breathing  . Swelling of face and throat  . A fast heartbeat  . A bad rash all over body  . Dizziness and weakness   Immunizations Administered    Name Date Dose VIS Date Route   PFIZER Comrnaty(Gray TOP) Covid-19 Vaccine 07/21/2020  3:08 PM 0.3 mL 02/06/2020 Intramuscular   Manufacturer: [employer]   Lot: [id number]   NDC: [phone]

## 2020-07-22 ENCOUNTER — Ambulatory Visit: Payer: 59

## 2020-07-28 ENCOUNTER — Other Ambulatory Visit (HOSPITAL_BASED_OUTPATIENT_CLINIC_OR_DEPARTMENT_OTHER): Payer: Self-pay

## 2020-07-28 MED ORDER — PFIZER-BIONT COVID-19 VAC-TRIS 30 MCG/0.3ML IM SUSP
INTRAMUSCULAR | 0 refills | Status: AC
  Filled 2020-07-28: qty 0.3, 1d supply, fill #0

## 2020-09-07 ENCOUNTER — Other Ambulatory Visit (HOSPITAL_BASED_OUTPATIENT_CLINIC_OR_DEPARTMENT_OTHER): Payer: Self-pay

## 2021-02-05 ENCOUNTER — Other Ambulatory Visit: Payer: Self-pay

## 2021-02-05 ENCOUNTER — Ambulatory Visit
Admission: EM | Admit: 2021-02-05 | Discharge: 2021-02-05 | Disposition: A | Discharge status: Home / Self Care | Payer: 59

## 2021-02-05 DIAGNOSIS — R6883 Chills (without fever): Secondary | ICD-10-CM | POA: Diagnosis not present

## 2021-02-05 DIAGNOSIS — R051 Acute cough: Secondary | ICD-10-CM

## 2021-02-05 DIAGNOSIS — R52 Pain, unspecified: Secondary | ICD-10-CM

## 2021-02-05 DIAGNOSIS — U071 COVID-19: Secondary | ICD-10-CM

## 2021-02-05 NOTE — ED Triage Notes (Signed)
Pt reports testing positive for Covid on Tuesday (at home test) and states she has had a cough, body aches and chills. Patient requesting to have Covid test done today.

## 2021-02-05 NOTE — ED Provider Notes (Signed)
[facility]    CSN: [id number] Arrival date & time: 02/05/21  0906    HISTORY  No chief complaint on file.  HPI Teresa Bean is a 35 y.o. female. Pt reports testing positive for Covid on Tuesday (at home test) and states she has had a cough, body aches and chills. Patient requesting to have Covid test done today.  Patient states she is overall feeling better at this time.  The history is provided by the patient.  Past Medical History:  Diagnosis Date   No pertinent past medical history    There are no problems to display for this patient.  Past Surgical History:  Procedure Laterality Date   NO PAST SURGERIES     OB History     Gravida  2   Para  2   Term  2   Preterm      AB      Living  2      SAB      IAB      Ectopic      Multiple      Live Births  1          Home Medications    Prior to Admission medications   Medication Sig Start Date End Date Taking? Authorizing Provider  acetaminophen (TYLENOL) 500 MG tablet Take 500 mg by mouth daily as needed. For headaches/pain    [provider]  calcium carbonate (TUMS - DOSED IN MG ELEMENTAL CALCIUM) 500 MG chewable tablet Chew 2 tablets by mouth at bedtime as needed. For heartburn    [provider]  COVID-19 mRNA Vac-TriS, Pfizer, (PFIZER-BIONT COVID-19 VAC-TRIS) SUSP injection Inject into the muscle. 07/21/20   Carlyle Basques, MD  ibuprofen (ADVIL) 800 MG tablet TAKE 1 TABLET BY MOUTH EVERY 8 HOURS AS NEEDED FOR PAIN WITH FOOD 06/03/20     norelgestromin-ethinyl estradiol Marilu Favre) 150-35 MCG/24HR transdermal patch Place 1 patch onto the skin once a week. Dispense as Marilu Favre. 12/05/18   Forrest Moron, MD  Prenatal Vit-Fe Fumarate-FA (PRENATAL MULTIVITAMIN) TABS Take 1 tablet by mouth daily.    [provider]   Family History Family History  Problem Relation Age of Onset   Other Neg Hx    Social History Social History   Tobacco Use   Smoking status:  Never   Smokeless tobacco: Never  Substance Use Topics   Alcohol use: No   Drug use: No   Allergies   Patient has no known allergies.  Review of Systems Review of Systems Pertinent findings noted in history of present illness.   Physical Exam Triage Vital Signs ED Triage Vitals  Enc Vitals Group     BP 12/25/20 0827 (!) 147/82     Pulse Rate 12/25/20 0827 72     Resp 12/25/20 0827 18     Temp 12/25/20 0827 98.3 F (36.8 C)     Temp Source 12/25/20 0827 Oral     SpO2 12/25/20 0827 98 %     Weight --      Height --      Head Circumference --      Peak Flow --      Pain Score 12/25/20 0826 5     Pain Loc --      Pain Edu? --      Excl. in [place]? --   No data found.  Updated Vital Signs BP 110/77 (BP Location: Right Arm)   Pulse 78   Temp  99 F (37.2 C) (Oral)   Resp 16   LMP 01/17/2021 (Approximate)   SpO2 97%   Physical Exam Vitals and nursing note reviewed.  Constitutional:      General: She is not in acute distress.    Appearance: Normal appearance. She is not ill-appearing.  HENT:     Head: Normocephalic and atraumatic.     Salivary Glands: Right salivary gland is not diffusely enlarged or tender. Left salivary gland is not diffusely enlarged or tender.     Right Ear: Tympanic membrane, ear canal and external ear normal. No drainage. No middle ear effusion. There is no impacted cerumen. Tympanic membrane is not erythematous or bulging.     Left Ear: Tympanic membrane, ear canal and external ear normal. No drainage.  No middle ear effusion. There is no impacted cerumen. Tympanic membrane is not erythematous or bulging.     Nose: Nose normal. No nasal deformity, septal deviation, mucosal edema, congestion or rhinorrhea.     Right Turbinates: Not enlarged, swollen or pale.     Left Turbinates: Not enlarged, swollen or pale.     Right Sinus: No maxillary sinus tenderness or frontal sinus tenderness.     Left Sinus: No maxillary sinus tenderness or frontal sinus  tenderness.     Mouth/Throat:     Lips: Pink. No lesions.     Mouth: Mucous membranes are moist. No oral lesions.     Pharynx: Oropharynx is clear. Uvula midline. Posterior oropharyngeal erythema and uvula swelling present.     Tonsils: No tonsillar exudate. 0 on the right. 0 on the left.  Eyes:     General: Lids are normal.        Right eye: No discharge.        Left eye: No discharge.     Extraocular Movements: Extraocular movements intact.     Conjunctiva/sclera: Conjunctivae normal.     Right eye: Right conjunctiva is not injected.     Left eye: Left conjunctiva is not injected.  Neck:     Trachea: Trachea and phonation normal.  Cardiovascular:     Rate and Rhythm: Normal rate and regular rhythm.     Pulses: Normal pulses.     Heart sounds: Normal heart sounds. No murmur heard.   No friction rub. No gallop.  Pulmonary:     Effort: Pulmonary effort is normal. No accessory muscle usage, prolonged expiration or respiratory distress.     Breath sounds: Normal breath sounds. No stridor, decreased air movement or transmitted upper airway sounds. No decreased breath sounds, wheezing, rhonchi or rales.  Chest:     Chest wall: No tenderness.  Musculoskeletal:        General: Normal range of motion.     Cervical back: Normal range of motion and neck supple. Normal range of motion.  Lymphadenopathy:     Cervical: No cervical adenopathy.  Skin:    General: Skin is warm and dry.     Findings: No erythema or rash.  Neurological:     General: No focal deficit present.     Mental Status: She is alert and oriented to person, place, and time.  Psychiatric:        Mood and Affect: Mood normal.        Behavior: Behavior normal.    Visual Acuity Right Eye Distance:   Left Eye Distance:   Bilateral Distance:    Right Eye Near:   Left Eye Near:    Bilateral Near:  UC Couse / Diagnostics / Procedures:    EKG  Radiology No results found.  Procedures Procedures (including  critical care time)  UC Diagnoses / Final Clinical Impressions(s)   I have reviewed the triage vital signs and the nursing notes.  Pertinent labs & imaging results that were available during my care of the patient were reviewed by me and considered in my medical decision making (see chart for details).   Final diagnoses:  Acute cough  Body aches  Chills  COVID-19   Physical exam is overall fairly unremarkable, patient advised that repeat COVID testing is not only not indicated but could be confusing as there it is a great chance of false negatives but there are no false positives.  Patient verbalized understanding.  Conservative care recommended.  ED Prescriptions   None    PDMP not reviewed this encounter.  Pending results:  Labs Reviewed - No data to display  Medications Ordered in UC: Medications - No data to display  Disposition Upon Discharge:  Condition: stable for discharge home Home: take medications as prescribed; routine discharge instructions as discussed; follow up as advised.  Patient presented with an acute illness with associated systemic symptoms and significant discomfort requiring urgent management. In my opinion, this is a condition that a prudent lay person (someone who possesses an average knowledge of health and medicine) may potentially expect to result in complications if not addressed urgently such as respiratory distress, impairment of bodily function or dysfunction of bodily organs.   Routine symptom specific, illness specific and/or disease specific instructions were discussed with the patient and/or caregiver at length.   As such, the patient has been evaluated and assessed, work-up was performed and treatment was provided in alignment with urgent care protocols and evidence based medicine.  Patient/parent/caregiver has been advised that the patient may require follow up for further testing and treatment if the symptoms continue in spite of treatment, as  clinically indicated and appropriate.  The patient was tested for COVID-19, Influenza and/or RSV, then the patient/parent/guardian was advised to isolate at home pending the results of his/her diagnostic coronavirus test and potentially longer if they're positive. I have also advised pt that if his/her COVID-19 test returns positive, it's recommended to self-isolate for at least 10 days after symptoms first appeared AND until fever-free for 24 hours without fever reducer AND other symptoms have improved or resolved. Discussed self-isolation recommendations as well as instructions for household member/close contacts as per the [facility] and [place] DHHS, and also gave patient the [place] packet with this information.  Patient/parent/caregiver has been advised to return to the [facility] or PCP in 3-5 days if no better; to PCP or the Emergency Department if new signs and symptoms develop, or if the current signs or symptoms continue to change or worsen for further workup, evaluation and treatment as clinically indicated and appropriate  The patient will follow up with their current PCP if and as advised. If the patient does not currently have a PCP we will assist them in obtaining one.   The patient may need specialty follow up if the symptoms continue, in spite of conservative treatment and management, for further workup, evaluation, consultation and treatment as clinically indicated and appropriate.  Patient/parent/caregiver verbalized understanding and agreement of plan as discussed.  All questions were addressed during visit.  Please see discharge instructions below for further details of plan.  Discharge Instructions:   Discharge Instructions      Please remain home from work, school, public  places until you have been fever free for 24 hours without the use of antifever medications such as Tylenol or ibuprofen.  Conservative care is recommended at this time.  This includes rest, pushing clear fluids and activity  as tolerated.  You may also noticed that your appetite is reduced, this is okay as long as they are drinking plenty of clear fluids.  Acetaminophen (Tylenol): This is a good fever reducer.  If there body temperature rises above 101.5 as measured with a thermometer, it is recommended that you give them 1,000 mg every 6-8 hours until they are temperature falls below 101.5, please not take more than 3,000 mg of acetaminophen either as a separate medication or as in ingredient in an over-the-counter cold/flu preparation within a 24-hour period  Ibuprofen  (Advil, Motrin): This is a good anti-inflammatory medication which addresses aches and pains and, to some degree, congestion in the nasal passages.  I recommend giving between 400 to 600 mg every 6-8 hours as needed.  Pseudoephedrine (Sudafed): This is a decongestant.  This medication has to be purchased from the pharmacist counter, I recommend giving 2 tablets, 60 mg, 2-3 times a day as needed to relieve runny nose and sinus drainage.  Guaifenesin (Robitussin, Mucinex): This is an expectorant.  This helps break up chest congestion and loosen up thick nasal drainage making phlegm and drainage more liquid and therefore easier to remove.  I recommend being 400 mg three times daily as needed.  Dextromethorphan (any cough medicine with the letters "DM" added to it's name such as Robitussin DM): This is a cough suppressant.  This is often recommended to be taken at nighttime to suppress cough and help children sleep.  Give dosage as directed on the bottle.   Chloraseptic Throat Spray: Spray 5 sprays into affected area every 2 hours, hold for 15 seconds and either swallow or spit it out.  This is a excellent numbing medication because it is a spray, you can put it right where you needed and so sucking on a lozenge and numbing your entire mouth.  Based on my physical exam findings and the history provided  today, I do not see any evidence of bacterial infection  therefore treatment with antibiotics would be of no benefit.  Please follow-up within the next 3 to 5 days either with your primary care provider or urgent care if your symptoms do not resolve.  If you do not have a primary care provider, we will assist you in finding one.         Lynden Oxford Scales, PA-C 02/05/21 1546

## 2021-02-05 NOTE — Discharge Instructions (Signed)
Please remain home from work, school, public places until you have been fever free for 24 hours without the use of antifever medications such as Tylenol or ibuprofen.  Conservative care is recommended at this time.  This includes rest, pushing clear fluids and activity as tolerated.  You may also noticed that your appetite is reduced, this is okay as long as they are drinking plenty of clear fluids.  Acetaminophen (Tylenol): This is a good fever reducer.  If there body temperature rises above 101.5 as measured with a thermometer, it is recommended that you give them 1,000 mg every 6-8 hours until they are temperature falls below 101.5, please not take more than 3,000 mg of acetaminophen either as a separate medication or as in ingredient in an over-the-counter cold/flu preparation within a 24-hour period  Ibuprofen  (Advil, Motrin): This is a good anti-inflammatory medication which addresses aches and pains and, to some degree, congestion in the nasal passages.  I recommend giving between 400 to 600 mg every 6-8 hours as needed.  Pseudoephedrine (Sudafed): This is a decongestant.  This medication has to be purchased from the pharmacist counter, I recommend giving 2 tablets, 60 mg, 2-3 times a day as needed to relieve runny nose and sinus drainage.  Guaifenesin (Robitussin, Mucinex): This is an expectorant.  This helps break up chest congestion and loosen up thick nasal drainage making phlegm and drainage more liquid and therefore easier to remove.  I recommend being 400 mg three times daily as needed.  Dextromethorphan (any cough medicine with the letters "DM" added to it's name such as Robitussin DM): This is a cough suppressant.  This is often recommended to be taken at nighttime to suppress cough and help children sleep.  Give dosage as directed on the bottle.   Chloraseptic Throat Spray: Spray 5 sprays into affected area every 2 hours, hold for 15 seconds and either swallow or spit it out.  This is  a excellent numbing medication because it is a spray, you can put it right where you needed and so sucking on a lozenge and numbing your entire mouth.  Based on my physical exam findings and the history provided  today, I do not see any evidence of bacterial infection therefore treatment with antibiotics would be of no benefit.  Please follow-up within the next 3 to 5 days either with your primary care provider or urgent care if your symptoms do not resolve.  If you do not have a primary care provider, we will assist you in finding one.

## 2021-03-11 DIAGNOSIS — Z3200 Encounter for pregnancy test, result unknown: Secondary | ICD-10-CM | POA: Diagnosis not present

## 2021-03-12 ENCOUNTER — Other Ambulatory Visit (HOSPITAL_BASED_OUTPATIENT_CLINIC_OR_DEPARTMENT_OTHER): Payer: Self-pay

## 2021-03-12 MED ORDER — ONDANSETRON 4 MG PO TBDP
ORAL_TABLET | ORAL | 3 refills | Status: AC
  Filled 2021-03-12: qty 30, 10d supply, fill #0
  Filled 2021-04-09 – 2021-05-13 (×2): qty 30, 10d supply, fill #1

## 2021-03-24 DIAGNOSIS — O09891 Supervision of other high risk pregnancies, first trimester: Secondary | ICD-10-CM | POA: Diagnosis not present

## 2021-03-24 DIAGNOSIS — Z3200 Encounter for pregnancy test, result unknown: Secondary | ICD-10-CM | POA: Diagnosis not present

## 2021-03-24 DIAGNOSIS — T8331XA Breakdown (mechanical) of intrauterine contraceptive device, initial encounter: Secondary | ICD-10-CM | POA: Diagnosis not present

## 2021-04-06 ENCOUNTER — Other Ambulatory Visit: Payer: Self-pay | Admitting: Obstetrics and Gynecology

## 2021-04-06 DIAGNOSIS — T8331XA Breakdown (mechanical) of intrauterine contraceptive device, initial encounter: Secondary | ICD-10-CM

## 2021-04-06 DIAGNOSIS — Z363 Encounter for antenatal screening for malformations: Secondary | ICD-10-CM

## 2021-04-06 DIAGNOSIS — Z3A12 12 weeks gestation of pregnancy: Secondary | ICD-10-CM

## 2021-04-08 ENCOUNTER — Ambulatory Visit: Payer: 59

## 2021-04-08 ENCOUNTER — Other Ambulatory Visit: Payer: Self-pay

## 2021-04-08 ENCOUNTER — Ambulatory Visit: Payer: 59 | Attending: Obstetrics and Gynecology

## 2021-04-08 ENCOUNTER — Ambulatory Visit: Payer: 59 | Admitting: *Deleted

## 2021-04-08 VITALS — BP 114/68 | HR 80

## 2021-04-08 DIAGNOSIS — Z331 Pregnant state, incidental: Secondary | ICD-10-CM | POA: Insufficient documentation

## 2021-04-08 DIAGNOSIS — Z363 Encounter for antenatal screening for malformations: Secondary | ICD-10-CM | POA: Insufficient documentation

## 2021-04-08 DIAGNOSIS — O09521 Supervision of elderly multigravida, first trimester: Secondary | ICD-10-CM | POA: Diagnosis not present

## 2021-04-08 DIAGNOSIS — T8331XA Breakdown (mechanical) of intrauterine contraceptive device, initial encounter: Secondary | ICD-10-CM | POA: Diagnosis not present

## 2021-04-08 DIAGNOSIS — O3411 Maternal care for benign tumor of corpus uteri, first trimester: Secondary | ICD-10-CM

## 2021-04-08 DIAGNOSIS — Z3A12 12 weeks gestation of pregnancy: Secondary | ICD-10-CM | POA: Diagnosis not present

## 2021-04-08 DIAGNOSIS — D259 Leiomyoma of uterus, unspecified: Secondary | ICD-10-CM

## 2021-04-09 ENCOUNTER — Other Ambulatory Visit (HOSPITAL_BASED_OUTPATIENT_CLINIC_OR_DEPARTMENT_OTHER): Payer: Self-pay

## 2021-04-16 ENCOUNTER — Other Ambulatory Visit (HOSPITAL_BASED_OUTPATIENT_CLINIC_OR_DEPARTMENT_OTHER): Payer: Self-pay

## 2021-04-20 ENCOUNTER — Ambulatory Visit: Payer: 59

## 2021-04-20 ENCOUNTER — Other Ambulatory Visit: Payer: 59

## 2021-05-13 ENCOUNTER — Other Ambulatory Visit (HOSPITAL_BASED_OUTPATIENT_CLINIC_OR_DEPARTMENT_OTHER): Payer: Self-pay

## 2021-05-17 ENCOUNTER — Other Ambulatory Visit (HOSPITAL_BASED_OUTPATIENT_CLINIC_OR_DEPARTMENT_OTHER): Payer: Self-pay

## 2021-05-17 MED ORDER — FLUCONAZOLE 150 MG PO TABS
150.0000 mg | ORAL_TABLET | ORAL | 0 refills | Status: AC
  Filled 2021-05-17: qty 1, 1d supply, fill #0

## 2021-05-20 DIAGNOSIS — Z130 Encounter for screening for diseases of the blood and blood-forming organs and certain disorders involving the immune mechanism: Secondary | ICD-10-CM | POA: Diagnosis not present

## 2021-05-20 DIAGNOSIS — Z1159 Encounter for screening for other viral diseases: Secondary | ICD-10-CM | POA: Diagnosis not present

## 2021-05-20 DIAGNOSIS — Z13220 Encounter for screening for lipoid disorders: Secondary | ICD-10-CM | POA: Diagnosis not present

## 2021-05-20 DIAGNOSIS — Z114 Encounter for screening for human immunodeficiency virus [HIV]: Secondary | ICD-10-CM | POA: Diagnosis not present

## 2021-05-20 DIAGNOSIS — Z131 Encounter for screening for diabetes mellitus: Secondary | ICD-10-CM | POA: Diagnosis not present

## 2021-05-20 DIAGNOSIS — Z113 Encounter for screening for infections with a predominantly sexual mode of transmission: Secondary | ICD-10-CM | POA: Diagnosis not present

## 2021-05-20 DIAGNOSIS — Z1329 Encounter for screening for other suspected endocrine disorder: Secondary | ICD-10-CM | POA: Diagnosis not present

## 2021-05-20 DIAGNOSIS — Z01419 Encounter for gynecological examination (general) (routine) without abnormal findings: Secondary | ICD-10-CM | POA: Diagnosis not present

## 2021-05-21 ENCOUNTER — Other Ambulatory Visit (HOSPITAL_BASED_OUTPATIENT_CLINIC_OR_DEPARTMENT_OTHER): Payer: Self-pay

## 2021-07-27 DIAGNOSIS — H5213 Myopia, bilateral: Secondary | ICD-10-CM | POA: Diagnosis not present

## 2021-10-05 ENCOUNTER — Other Ambulatory Visit (HOSPITAL_BASED_OUTPATIENT_CLINIC_OR_DEPARTMENT_OTHER): Payer: Self-pay

## 2021-10-05 DIAGNOSIS — Z23 Encounter for immunization: Secondary | ICD-10-CM | POA: Diagnosis not present

## 2021-10-05 MED ORDER — ATOVAQUONE-PROGUANIL HCL 250-100 MG PO TABS
ORAL_TABLET | ORAL | 0 refills | Status: AC
  Filled 2021-10-05: qty 23, 23d supply, fill #0

## 2021-10-05 MED ORDER — VIVOTIF PO CPDR
DELAYED_RELEASE_CAPSULE | ORAL | 0 refills | Status: AC
  Filled 2021-10-05: qty 4, 7d supply, fill #0

## 2021-10-05 MED ORDER — AZITHROMYCIN 500 MG PO TABS
ORAL_TABLET | ORAL | 0 refills | Status: AC
  Filled 2021-10-05: qty 2, 1d supply, fill #0

## 2021-10-11 DIAGNOSIS — Z23 Encounter for immunization: Secondary | ICD-10-CM | POA: Diagnosis not present

## 2022-07-21 DIAGNOSIS — D259 Leiomyoma of uterus, unspecified: Secondary | ICD-10-CM | POA: Diagnosis not present

## 2022-07-21 DIAGNOSIS — Z0000 Encounter for general adult medical examination without abnormal findings: Secondary | ICD-10-CM | POA: Diagnosis not present

## 2022-07-21 DIAGNOSIS — R110 Nausea: Secondary | ICD-10-CM | POA: Diagnosis not present

## 2022-07-21 DIAGNOSIS — Z1159 Encounter for screening for other viral diseases: Secondary | ICD-10-CM | POA: Diagnosis not present

## 2022-07-21 DIAGNOSIS — Z113 Encounter for screening for infections with a predominantly sexual mode of transmission: Secondary | ICD-10-CM | POA: Diagnosis not present

## 2022-07-21 DIAGNOSIS — N920 Excessive and frequent menstruation with regular cycle: Secondary | ICD-10-CM | POA: Diagnosis not present

## 2022-07-21 DIAGNOSIS — Z131 Encounter for screening for diabetes mellitus: Secondary | ICD-10-CM | POA: Diagnosis not present

## 2022-07-21 DIAGNOSIS — Z114 Encounter for screening for human immunodeficiency virus [HIV]: Secondary | ICD-10-CM | POA: Diagnosis not present

## 2022-07-21 DIAGNOSIS — Z01419 Encounter for gynecological examination (general) (routine) without abnormal findings: Secondary | ICD-10-CM | POA: Diagnosis not present

## 2022-07-22 DIAGNOSIS — Z13220 Encounter for screening for lipoid disorders: Secondary | ICD-10-CM | POA: Diagnosis not present

## 2022-08-01 ENCOUNTER — Other Ambulatory Visit: Payer: Self-pay

## 2022-08-01 ENCOUNTER — Other Ambulatory Visit (HOSPITAL_BASED_OUTPATIENT_CLINIC_OR_DEPARTMENT_OTHER): Payer: Self-pay

## 2022-08-01 ENCOUNTER — Emergency Department (HOSPITAL_BASED_OUTPATIENT_CLINIC_OR_DEPARTMENT_OTHER)
Admission: EM | Admit: 2022-08-01 | Discharge: 2022-08-01 | Disposition: A | Discharge status: Home / Self Care | Payer: 59 | Attending: Emergency Medicine | Admitting: Emergency Medicine

## 2022-08-01 DIAGNOSIS — L0231 Cutaneous abscess of buttock: Secondary | ICD-10-CM | POA: Insufficient documentation

## 2022-08-01 DIAGNOSIS — L0291 Cutaneous abscess, unspecified: Secondary | ICD-10-CM

## 2022-08-01 LAB — CBC WITH DIFFERENTIAL/PLATELET
Abs Immature Granulocytes: 0.07 10*3/uL (ref 0.00–0.07)
Basophils Absolute: 0.1 10*3/uL (ref 0.0–0.1)
Basophils Relative: 0 %
Eosinophils Absolute: 0.3 10*3/uL (ref 0.0–0.5)
Eosinophils Relative: 2 %
HCT: 35.1 % — ABNORMAL LOW (ref 36.0–46.0)
Hemoglobin: 11 g/dL — ABNORMAL LOW (ref 12.0–15.0)
Immature Granulocytes: 0 %
Lymphocytes Relative: 8 %
Lymphs Abs: 1.2 10*3/uL (ref 0.7–4.0)
MCH: 25.6 pg — ABNORMAL LOW (ref 26.0–34.0)
MCHC: 31.3 g/dL (ref 30.0–36.0)
MCV: 81.6 fL (ref 80.0–100.0)
Monocytes Absolute: 0.7 10*3/uL (ref 0.1–1.0)
Monocytes Relative: 4 %
Neutro Abs: 13.9 10*3/uL — ABNORMAL HIGH (ref 1.7–7.7)
Neutrophils Relative %: 86 %
Platelets: 343 10*3/uL (ref 150–400)
RBC: 4.3 MIL/uL (ref 3.87–5.11)
RDW: 18.9 % — ABNORMAL HIGH (ref 11.5–15.5)
WBC: 16.2 10*3/uL — ABNORMAL HIGH (ref 4.0–10.5)
nRBC: 0 % (ref 0.0–0.2)

## 2022-08-01 LAB — BASIC METABOLIC PANEL
Anion gap: 11 (ref 5–15)
BUN: 10 mg/dL (ref 6–20)
CO2: 22 mmol/L (ref 22–32)
Calcium: 9.1 mg/dL (ref 8.9–10.3)
Chloride: 102 mmol/L (ref 98–111)
Creatinine, Ser: 0.78 mg/dL (ref 0.44–1.00)
GFR, Estimated: >60 mL/min (ref >60)
Glucose, Bld: 145 mg/dL — ABNORMAL HIGH (ref 70–99)
Potassium: 3.4 mmol/L — ABNORMAL LOW (ref 3.5–5.1)
Sodium: 135 mmol/L (ref 135–145)

## 2022-08-01 MED ORDER — ACETAMINOPHEN 325 MG PO TABS
650.0000 mg | ORAL_TABLET | Freq: Once | ORAL | Status: AC
  Administered 2022-08-01: 650 mg via ORAL
  Filled 2022-08-01: qty 2

## 2022-08-01 MED ORDER — DOXYCYCLINE HYCLATE 100 MG PO CAPS
100.0000 mg | ORAL_CAPSULE | Freq: Two times a day (BID) | ORAL | 0 refills | Status: AC
  Filled 2022-08-01: qty 14, 7d supply, fill #0

## 2022-08-01 MED ORDER — LIDOCAINE-EPINEPHRINE (PF) 2 %-1:200000 IJ SOLN
20.0000 mL | Freq: Once | INTRAMUSCULAR | Status: AC
  Administered 2022-08-01: 20 mL
  Filled 2022-08-01: qty 20

## 2022-08-01 NOTE — ED Notes (Signed)
Report rec'd from prev RN 

## 2022-08-01 NOTE — ED Provider Notes (Signed)
[facility] Provider Note   CSN: [id number] Arrival date & time: 08/01/22  1038     History  Chief Complaint  Patient presents with   Abscess    Teresa Bean is a 37 y.o. female with no significant past medical history who presents to the ED today for an abscess. Patient reports she first noticed what she thought was an ingrown hair on her left buttock 3 days ago. She began developing increase pain to the site and noticed it was starting to get bigger in size. She does report fever and chills for the past 2 days but she says that occurs sometimes with her menstrual cycle. No additional complaints or concerns.    Home Medications Prior to Admission medications   Medication Sig Start Date End Date Taking? Authorizing Provider  doxycycline (VIBRAMYCIN) 100 MG capsule Take 1 capsule (100 mg total) by mouth 2 (two) times daily for 7 days. 08/01/22 08/08/22 Yes Maxwell Marion, PA-C  acetaminophen (TYLENOL) 500 MG tablet Take 500 mg by mouth daily as needed. For headaches/pain    [provider]  atovaquone-proguanil (MALARONE) 250-100 MG TABS tablet Take 1 tablet by mouth daily for malaria prevention, starting 2 days before departure. Take daily while in mal[place] area and continue to take daily for 1 week after return 10/05/21     azithromycin (ZITHROMAX) 500 MG tablet Take 2 tablets by mouth at once if needed for moderate to severe traveler's diarrhea 10/05/21     COVID-19 mRNA Vac-TriS, Pfizer, (PFIZER-BIONT COVID-19 VAC-TRIS) SUSP injection Inject into the muscle. Patient not taking: Reported on 04/08/2021 07/21/20   Judyann Munson, MD  fluconazole (DIFLUCAN) 150 MG tablet Take 1 tablet (150 mg total) by mouth as directed. 05/17/21     ondansetron (ZOFRAN-ODT) 4 MG disintegrating tablet Take 1 tablet mouth every eight hours as needed nausea and vomiting 03/12/21     Prenatal Vit-Fe Fumarate-FA (PRENATAL MULTIVITAMIN) TABS Take 1 tablet by  mouth daily.    [provider]  typhoid (VIVOTIF) DR capsule Take 1 capsule by mouth on an empty stomach every other day for 4 doses. Complete atleast 1 week before departure. Must be refrigerated 10/05/21         Allergies    Patient has no known allergies.    Review of Systems   Review of Systems  Skin:        Abscess to left medial buttock    Physical Exam Updated Vital Signs BP 128/78   Pulse (!) 114   Temp (!) 100.4 F (38 C) (Oral)   Resp 17   Ht 5\' 4"  (1.626 m)   Wt 82.1 kg   SpO2 98%   BMI 31.07 kg/m  Physical Exam Vitals and nursing note reviewed.  Constitutional:      Appearance: Normal appearance.  HENT:     Head: Normocephalic and atraumatic.     Mouth/Throat:     Mouth: Mucous membranes are moist.  Eyes:     Conjunctiva/sclera: Conjunctivae normal.     Pupils: Pupils are equal, round, and reactive to light.  Cardiovascular:     Rate and Rhythm: Normal rate and regular rhythm.     Pulses: Normal pulses.  Pulmonary:     Effort: Pulmonary effort is normal.     Breath sounds: Normal breath sounds.  Abdominal:     Palpations: Abdomen is soft.     Tenderness: There is no abdominal tenderness.  Skin:  General: Skin is warm and dry.     Findings: No rash.     Comments: Abscess to medial aspect of left buttock  Neurological:     General: No focal deficit present.     Mental Status: She is alert.  Psychiatric:        Mood and Affect: Mood normal.        Behavior: Behavior normal.     ED Results / Procedures / Treatments   Labs (all labs ordered are listed, but only abnormal results are displayed) Labs Reviewed  CBC WITH DIFFERENTIAL/PLATELET - Abnormal; Notable for the following components:      Result Value   WBC 16.2 (*)    Hemoglobin 11.0 (*)    HCT 35.1 (*)    MCH 25.6 (*)    RDW 18.9 (*)    Neutro Abs 13.9 (*)    All other components within normal limits  BASIC METABOLIC PANEL - Abnormal; Notable for the following components:    Potassium 3.4 (*)    Glucose, Bld 145 (*)    All other components within normal limits    EKG None  Radiology No results found.  Procedures .Marland KitchenIncision and Drainage  Date/Time: 08/01/2022 12:50 PM  Performed by: Maxwell Marion, PA-C Authorized by: Maxwell Marion, PA-C   Consent:    Consent obtained:  Verbal   Consent given by:  Patient   Risks discussed:  Bleeding and pain   Alternatives discussed:  No treatment Universal protocol:    Procedure explained and questions answered to patient or proxy's satisfaction: yes     Relevant documents present and verified: yes     Test results available : yes     Imaging studies available: yes     Required blood products, implants, devices, and special equipment available: yes     Site/side marked: yes     Immediately prior to procedure, a time out was called: yes     Patient identity confirmed:  Verbally with patient Location:    Type:  Abscess   Location:  Anogenital   Anogenital location:  Gluteal cleft (left side) Pre-procedure details:    Skin preparation:  Betadine Anesthesia:    Anesthesia method:  Local infiltration   Local anesthetic:  Lidocaine 1% WITH epi Procedure type:    Complexity:  Complex Procedure details:    Incision types:  Single straight   Incision depth:  Subcutaneous   Wound management:  Probed and deloculated   Drainage:  Serosanguinous   Drainage amount:  Moderate   Wound treatment:  Wound left open   Packing materials:  1/2 in iodoform gauze Post-procedure details:    Procedure completion:  Tolerated well, no immediate complications    Medications Ordered in ED Medications  acetaminophen (TYLENOL) tablet 650 mg (650 mg Oral Given 08/01/22 1119)  lidocaine-EPINEPHrine (XYLOCAINE W/EPI) 2 %-1:200000 (PF) injection 20 mL (20 mLs Infiltration Given by Other 08/01/22 1119)    ED Course/ Medical Decision Making/ A&P                             Medical Decision Making Amount and/or Complexity of Data  Reviewed Labs: ordered.  Risk OTC drugs. Prescription drug management.   Patient presents today with an abscess on the medial aspect of her left buttock. Patient first noticed what looked to be an ingrown hair on Friday which has increased in size and become painful since then. She is febrile but  is not sure if that's due to her abscess or her menstrual cycle.  On physical exam, she has a 6 cm indurated abscess on the medial aspect of her left buttock. There was serosanguineous drainage from the incision site. It was packed with 1/2 in iodoform gauze and dressed. Patient given Doxycycline for 7 days. Instructed to remove packing in 3 days.        Final Clinical Impression(s) / ED Diagnoses Final diagnoses:  Abscess    Rx / DC Orders ED Discharge Orders          Ordered    doxycycline (VIBRAMYCIN) 100 MG capsule  2 times daily        08/01/22 1256              Maxwell Marion, PA-C 08/01/22 1552    Jacalyn Lefevre, MD 08/02/22 [phone]

## 2022-08-01 NOTE — ED Triage Notes (Signed)
Patient presents to ED via POV from home. Here with abscess to buttock since Friday.

## 2022-08-01 NOTE — Discharge Instructions (Addendum)
Take Doxycycline twice a day for 7 days to prevent an infection after abscess incision and drainage. You can alternate between Ibuprofen and Tylenol for pain every 4 hours as needed.  Packing can be removed in 3 days. You can do it yourself if you'd like. If you're concerned the abscess is not getting better or getting worse, please get re-evaluated in an urgent care or the ED if you are not able to get established with a PCP by then.  Information to for primary care provider given.

## 2022-08-04 ENCOUNTER — Emergency Department (HOSPITAL_BASED_OUTPATIENT_CLINIC_OR_DEPARTMENT_OTHER)
Admission: EM | Admit: 2022-08-04 | Discharge: 2022-08-04 | Disposition: A | Discharge status: Home / Self Care | Payer: 59 | Attending: Emergency Medicine | Admitting: Emergency Medicine

## 2022-08-04 ENCOUNTER — Other Ambulatory Visit: Payer: Self-pay

## 2022-08-04 ENCOUNTER — Encounter (HOSPITAL_BASED_OUTPATIENT_CLINIC_OR_DEPARTMENT_OTHER): Payer: Self-pay | Admitting: Emergency Medicine

## 2022-08-04 DIAGNOSIS — Z4800 Encounter for change or removal of nonsurgical wound dressing: Secondary | ICD-10-CM | POA: Insufficient documentation

## 2022-08-04 DIAGNOSIS — L0231 Cutaneous abscess of buttock: Secondary | ICD-10-CM | POA: Diagnosis not present

## 2022-08-04 DIAGNOSIS — Z4801 Encounter for change or removal of surgical wound dressing: Secondary | ICD-10-CM | POA: Diagnosis not present

## 2022-08-04 NOTE — Discharge Instructions (Signed)
Return if any problems.

## 2022-08-04 NOTE — ED Notes (Signed)
Discharge paperwork reviewed entirely with patient, including follow up care. Pain was under control. No prescriptions were called in, but all questions were addressed.  Pt verbalized understanding as well as all parties involved. No questions or concerns voiced at the time of discharge. No acute distress noted.   Pt ambulated out to PVA without incident or assistance.  

## 2022-08-04 NOTE — ED Triage Notes (Addendum)
Pt had left buttock abscess drained and packed on Monday. Here today requesting packing to be removed.

## 2022-08-04 NOTE — ED Provider Notes (Signed)
[facility] Provider Note   CSN: [id number] Arrival date & time: 08/04/22  1604     History  Chief Complaint  Patient presents with   Wound Check    Teresa Bean is a 37 y.o. female.  Patient is here for packing removal.  Patient denies any complaints.  Reports she is still having some drainage.  The history is provided by the patient. No language interpreter was used.  Wound Check       Home Medications Prior to Admission medications   Medication Sig Start Date End Date Taking? Authorizing Provider  acetaminophen (TYLENOL) 500 MG tablet Take 500 mg by mouth daily as needed. For headaches/pain    [provider]  atovaquone-proguanil (MALARONE) 250-100 MG TABS tablet Take 1 tablet by mouth daily for malaria prevention, starting 2 days before departure. Take daily while in malarious area and continue to take daily for 1 week after return 10/05/21     azithromycin (ZITHROMAX) 500 MG tablet Take 2 tablets by mouth at once if needed for moderate to severe traveler's diarrhea 10/05/21     COVID-19 mRNA Vac-TriS, Pfizer, (PFIZER-BIONT COVID-19 VAC-TRIS) SUSP injection Inject into the muscle. Patient not taking: Reported on 04/08/2021 07/21/20   Judyann Munson, MD  doxycycline (VIBRAMYCIN) 100 MG capsule Take 1 capsule (100 mg total) by mouth 2 (two) times daily for 7 days. 08/01/22 08/08/22  Maxwell Marion, PA-C  fluconazole (DIFLUCAN) 150 MG tablet Take 1 tablet (150 mg total) by mouth as directed. 05/17/21     ondansetron (ZOFRAN-ODT) 4 MG disintegrating tablet Take 1 tablet mouth every eight hours as needed nausea and vomiting 03/12/21     Prenatal Vit-Fe Fumarate-FA (PRENATAL MULTIVITAMIN) TABS Take 1 tablet by mouth daily.    [provider]  typhoid (VIVOTIF) DR capsule Take 1 capsule by mouth on an empty stomach every other day for 4 doses. Complete atleast 1 week before departure. Must be refrigerated 10/05/21          Allergies    Patient has no known allergies.    Review of Systems   Review of Systems  All other systems reviewed and are negative.   Physical Exam Updated Vital Signs BP 131/78 (BP Location: Left Arm)   Pulse 99   Temp 98.9 F (37.2 C) (Oral)   Resp 16   Ht 5\' 4"  (1.626 m)   Wt 82.1 kg   LMP 08/01/2022   SpO2 100%   BMI 31.07 kg/m  Physical Exam Vitals and nursing note reviewed.  Constitutional:      Appearance: She is well-developed.  HENT:     Head: Normocephalic.  Pulmonary:     Effort: Pulmonary effort is normal.  Abdominal:     General: There is no distension.  Musculoskeletal:     Cervical back: Normal range of motion.     Comments: Packing removed from incision left buttock, small amount of drainage, bandage replaced.  Neurological:     Mental Status: She is alert and oriented to person, place, and time.     ED Results / Procedures / Treatments   Labs (all labs ordered are listed, but only abnormal results are displayed) Labs Reviewed - No data to display  EKG None  Radiology No results found.  Procedures Procedures    Medications Ordered in ED Medications - No data to display  ED Course/ Medical Decision Making/ A&P  Medical Decision Making Patient is here for packing removal  Risk OTC drugs. Risk Details: Packing removed and bandage replaced patient counseled on continued wound care           Final Clinical Impression(s) / ED Diagnoses Final diagnoses:  Abscess packing removal    Rx / DC Orders ED Discharge Orders     None      An After Visit Summary was printed and given to the patient.    Elson Areas, [place] 08/04/22 1646    Terald Sleeper, MD 08/04/22 [phone]

## 2023-04-18 ENCOUNTER — Other Ambulatory Visit (HOSPITAL_COMMUNITY): Payer: Self-pay

## 2023-04-18 DIAGNOSIS — N898 Other specified noninflammatory disorders of vagina: Secondary | ICD-10-CM | POA: Diagnosis not present

## 2023-04-18 DIAGNOSIS — Z113 Encounter for screening for infections with a predominantly sexual mode of transmission: Secondary | ICD-10-CM | POA: Diagnosis not present

## 2023-04-18 MED ORDER — METRONIDAZOLE 500 MG PO TABS
500.0000 mg | ORAL_TABLET | Freq: Two times a day (BID) | ORAL | 1 refills | Status: AC
  Filled 2023-04-18: qty 14, 7d supply, fill #0
  Filled 2023-04-24: qty 14, 7d supply, fill #1

## 2023-08-16 ENCOUNTER — Other Ambulatory Visit (HOSPITAL_BASED_OUTPATIENT_CLINIC_OR_DEPARTMENT_OTHER): Payer: Self-pay

## 2023-08-16 MED ORDER — ONDANSETRON 4 MG PO TBDP
4.0000 mg | ORAL_TABLET | Freq: Four times a day (QID) | ORAL | 0 refills | Status: AC | PRN
  Filled 2023-08-16: qty 20, 5d supply, fill #0

## 2023-09-13 ENCOUNTER — Other Ambulatory Visit (HOSPITAL_BASED_OUTPATIENT_CLINIC_OR_DEPARTMENT_OTHER): Payer: Self-pay

## 2023-09-13 MED ORDER — ETONOGESTREL-ETHINYL ESTRADIOL 0.12-0.015 MG/24HR VA RING
VAGINAL_RING | VAGINAL | 3 refills | Status: AC
  Filled 2023-09-13: qty 3, 84d supply, fill #0
  Filled 2023-11-17 – 2023-11-20 (×2): qty 3, 84d supply, fill #1
  Filled 2023-12-22 – 2024-02-09 (×4): qty 3, 84d supply, fill #2

## 2023-09-22 DIAGNOSIS — Z130 Encounter for screening for diseases of the blood and blood-forming organs and certain disorders involving the immune mechanism: Secondary | ICD-10-CM | POA: Diagnosis not present

## 2023-09-22 DIAGNOSIS — Z1329 Encounter for screening for other suspected endocrine disorder: Secondary | ICD-10-CM | POA: Diagnosis not present

## 2023-09-22 DIAGNOSIS — Z131 Encounter for screening for diabetes mellitus: Secondary | ICD-10-CM | POA: Diagnosis not present

## 2023-09-22 DIAGNOSIS — Z0000 Encounter for general adult medical examination without abnormal findings: Secondary | ICD-10-CM | POA: Diagnosis not present

## 2023-09-22 DIAGNOSIS — Z13220 Encounter for screening for lipoid disorders: Secondary | ICD-10-CM | POA: Diagnosis not present

## 2023-09-26 DIAGNOSIS — Z01419 Encounter for gynecological examination (general) (routine) without abnormal findings: Secondary | ICD-10-CM | POA: Diagnosis not present

## 2023-09-26 DIAGNOSIS — Z5189 Encounter for other specified aftercare: Secondary | ICD-10-CM | POA: Diagnosis not present

## 2023-09-26 DIAGNOSIS — N939 Abnormal uterine and vaginal bleeding, unspecified: Secondary | ICD-10-CM | POA: Diagnosis not present

## 2023-10-03 DIAGNOSIS — R9389 Abnormal findings on diagnostic imaging of other specified body structures: Secondary | ICD-10-CM | POA: Diagnosis not present

## 2023-10-03 DIAGNOSIS — D251 Intramural leiomyoma of uterus: Secondary | ICD-10-CM | POA: Diagnosis not present

## 2023-10-03 DIAGNOSIS — O0380 Unspecified complication following complete or unspecified spontaneous abortion: Secondary | ICD-10-CM | POA: Diagnosis not present

## 2023-10-26 DIAGNOSIS — N939 Abnormal uterine and vaginal bleeding, unspecified: Secondary | ICD-10-CM | POA: Diagnosis not present

## 2023-11-17 ENCOUNTER — Other Ambulatory Visit (HOSPITAL_BASED_OUTPATIENT_CLINIC_OR_DEPARTMENT_OTHER): Payer: Self-pay

## 2023-11-20 ENCOUNTER — Other Ambulatory Visit (HOSPITAL_BASED_OUTPATIENT_CLINIC_OR_DEPARTMENT_OTHER): Payer: Self-pay

## 2023-11-20 ENCOUNTER — Other Ambulatory Visit: Payer: Self-pay

## 2023-12-22 ENCOUNTER — Other Ambulatory Visit (HOSPITAL_BASED_OUTPATIENT_CLINIC_OR_DEPARTMENT_OTHER): Payer: Self-pay

## 2024-01-30 ENCOUNTER — Other Ambulatory Visit (HOSPITAL_BASED_OUTPATIENT_CLINIC_OR_DEPARTMENT_OTHER): Payer: Self-pay

## 2024-01-31 ENCOUNTER — Other Ambulatory Visit: Payer: Self-pay | Admitting: Interventional Radiology

## 2024-01-31 DIAGNOSIS — D259 Leiomyoma of uterus, unspecified: Secondary | ICD-10-CM

## 2024-02-09 ENCOUNTER — Ambulatory Visit (HOSPITAL_COMMUNITY): Admission: RE | Admit: 2024-02-09 | Discharge: 2024-02-09 | Attending: Interventional Radiology

## 2024-02-09 ENCOUNTER — Other Ambulatory Visit (HOSPITAL_BASED_OUTPATIENT_CLINIC_OR_DEPARTMENT_OTHER): Payer: Self-pay

## 2024-02-09 DIAGNOSIS — D259 Leiomyoma of uterus, unspecified: Secondary | ICD-10-CM | POA: Diagnosis not present

## 2024-02-09 DIAGNOSIS — D252 Subserosal leiomyoma of uterus: Secondary | ICD-10-CM | POA: Diagnosis not present

## 2024-02-09 DIAGNOSIS — N854 Malposition of uterus: Secondary | ICD-10-CM | POA: Diagnosis not present

## 2024-02-09 MED ORDER — GADOBUTROL 1 MMOL/ML IV SOLN
8.0000 mL | Freq: Once | INTRAVENOUS | Status: AC | PRN
  Administered 2024-02-09: 8 mL via INTRAVENOUS

## 2024-02-27 ENCOUNTER — Other Ambulatory Visit: Payer: Self-pay | Admitting: Obstetrics and Gynecology

## 2024-02-27 DIAGNOSIS — D259 Leiomyoma of uterus, unspecified: Secondary | ICD-10-CM

## 2024-03-11 NOTE — Consult Note (Incomplete)
 "      Chief Complaint: Patient was seen in consultation today for uterine leiomyoma at the request of Cousins,Sheronette  Referring Physician(s): Cousins,Sheronette  Consulting Physician: Karalee Beat  History of Present Illness: Teresa Bean is a 39 y.o. female with a history of menorrhagia for the past several years. Patient reports average length periods lasting around 5-6 days with 3-5 days of heavy bleeding requiring her to change her tampon/pad every few hours. She was started on the NuvaRing over a year ago for both birth control and symptomatic management of her bleeding. Patient reports keeping her NuvaRing for all but 2 cycles during the past year due to her menorrhagia. This was helping up until recently when she started to have breakthrough bleeding. She reports a normal length cycle in late December followed by multiple episodes of spotting already in January. At her most recent OBGYN appointment with Dr. Rutherford, she was also instructed to start taking iron supplements due to being anemic. Additionally reports minor complaints of abdominal bloating and fullness, but admits this is not her primary concern. She currently denies any issues with dysmenorrhea, frequent urination, constipation, or frequent infections. She reports previous pelvic US  with Dr. Rutherford demonstrated several uterine fibroids leading to her subsequent MR Pelvis on 02/09/24 which also demonstrated:   Bulky and lobulated anteverted uterus with multiple intramural and subserosal leiomyomas with largest in the lower anterior uterine body measuring up to 4.0 x 5.4 cm. This leiomyoma exhibits T1 hyperintense areas and is non-enhancing on the post-contrast images, favoring hemorrhagic degeneration. The remaining leiomyomas are T2 hypointense and exhibit enhancement, suggesting viability. There is a pedunculated subserosal leiomyoma arising from the left frontal region measuring up to 1.9 x 2.4cm.  She is not  interested in surgical options previously discussed with Dr. Rutherford and was subsequently referred to IR and presents today to discuss management options including uterine fibroid embolization. Her current symptom severity score is 25/100 with an HR-QoL score of 78/100 indicating mild/moderate severity symptoms that are mildly impacting her quality of life.    Past Medical History:  Diagnosis Date   Medical history non-contributory    No pertinent past medical history     Past Surgical History:  Procedure Laterality Date   NO PAST SURGERIES     WISDOM TOOTH EXTRACTION Right     Allergies: Patient has no known allergies.  Medications: Prior to Admission medications  Medication Sig Start Date End Date Taking? Authorizing Provider  acetaminophen  (TYLENOL ) 500 MG tablet Take 500 mg by mouth daily as needed. For headaches/pain    [provider]  atovaquone -proguanil (MALARONE ) 250-100 MG TABS tablet Take 1 tablet by mouth daily for malaria prevention, starting 2 days before departure. Take daily while in malarious area and continue to take daily for 1 week after return 10/05/21     azithromycin  (ZITHROMAX ) 500 MG tablet Take 2 tablets by mouth at once if needed for moderate to severe traveler's diarrhea 10/05/21     COVID-19 mRNA Vac-TriS, Pfizer, (PFIZER-BIONT COVID-19 VAC-TRIS) SUSP injection Inject into the muscle. Patient not taking: Reported on 04/08/2021 07/21/20   Luiz Channel, MD  etonogestrel -ethinyl estradiol  (NUVARING) 0.12-0.015 MG/24HR vaginal ring Use 1 ring vaginally every month 08/30/23     fluconazole  (DIFLUCAN ) 150 MG tablet Take 1 tablet (150 mg total) by mouth as directed. 05/17/21     metroNIDAZOLE  (FLAGYL ) 500 MG tablet Take 1 tablet (500 mg total) by mouth 2 (two) times daily. 04/18/23     ondansetron  (ZOFRAN -ODT)  4 MG disintegrating tablet Take 1 tablet mouth every eight hours as needed nausea and vomiting 03/12/21     ondansetron  (ZOFRAN -ODT) 4 MG disintegrating  tablet Take 1 tablet (4 mg total) by mouth every 6 (six) hours as needed. 08/16/23     Prenatal Vit-Fe Fumarate-FA (PRENATAL MULTIVITAMIN) TABS Take 1 tablet by mouth daily.    [provider]  typhoid (VIVOTIF ) DR capsule Take 1 capsule by mouth on an empty stomach every other day for 4 doses. Complete atleast 1 week before departure. Must be refrigerated 10/05/21        Family History  Problem Relation Age of Onset   Hyperlipidemia Mother    Asthma Mother    Hypertension Father    Hyperlipidemia Father    Heart failure Father    Hyperlipidemia Sister    Hyperlipidemia Brother    Asthma Brother    Hypertension Paternal Grandmother    Cancer Paternal Grandmother    Other Neg Hx     Social History   Socioeconomic History   Marital status: Single    Spouse name: Not on file   Number of children: Not on file   Years of education: Not on file   Highest education level: Not on file  Occupational History   Not on file  Tobacco Use   Smoking status: Never   Smokeless tobacco: Never  Vaping Use   Vaping status: Never Used  Substance and Sexual Activity   Alcohol use: Not Currently    Comment: not while preg   Drug use: No   Sexual activity: Yes  Other Topics Concern   Not on file  Social History Narrative   Not on file   Social Drivers of Health   Tobacco Use: Low Risk (08/04/2022)   Patient History    Smoking Tobacco Use: Never    Smokeless Tobacco Use: Never    Passive Exposure: Not on file  Financial Resource Strain: Not on file  Food Insecurity: Not on file  Transportation Needs: Not on file  Physical Activity: Not on file  Stress: Not on file  Social Connections: Not on file  Depression (EYV7-0): Not on file  Alcohol Screen: Not on file  Housing: Not on file  Utilities: Not on file  Health Literacy: Not on file    Review of Systems  Constitutional:  Positive for fatigue.  Genitourinary:  Positive for menstrual problem (heavy bleeding during periods,  now with breakthrough bleeding).    Vital Signs: BP 125/77 (BP Location: Left Arm, Patient Position: Sitting, Cuff Size: Normal)   Pulse 78   Temp 97.6 F (36.4 C) (Oral)   Resp 16   Wt 180 lb (81.6 kg)   SpO2 100%   BMI 30.90 kg/m    Physical Exam Vitals reviewed.  Constitutional:      Appearance: Normal appearance.  Cardiovascular:     Rate and Rhythm: Normal rate.  Pulmonary:     Effort: Pulmonary effort is normal.  Abdominal:     General: Abdomen is flat.     Palpations: Abdomen is soft.     Tenderness: There is no abdominal tenderness.  Skin:    General: Skin is warm and dry.  Neurological:     Mental Status: She is alert and oriented to person, place, and time.  Psychiatric:        Behavior: Behavior normal.     Imaging: MR PELVIS W WO CONTRAST CLINICAL DATA:  Uterine leiomyoma.   EXAM: MRI PELVIS WITHOUT  AND WITH CONTRAST   TECHNIQUE: Multiplanar multisequence MR imaging of the pelvis was performed both before and after administration of intravenous contrast.   CONTRAST:  8mL GADAVIST  GADOBUTROL  1 MMOL/ML IV SOLN   COMPARISON:  None Available.   FINDINGS: Urinary Tract: Limited evaluation of bilateral kidneys on coronal T2 weighted images. Lower 1/3 of bilateral kidneys are seen and appears within normal limits. Urinary bladder is partially distended and is within normal limits.   Bowel: No disproportionate dilation of small or large bowel loops. Unremarkable appendix.   Vascular/Lymphatic: No pathologically enlarged lymph nodes. No significant vascular abnormality seen.   Reproductive: Bulky and lobulated anteverted uterus measuring 8.2 x 9.7 cm. There are multiple intramural and subserosal leiomyomas with largest in the lower anterior uterine body measuring up to 4.0 x 5.4 cm. This leiomyoma exhibits T1 hyperintense areas and is nonenhancing on the postcontrast images, favoring hemorrhagic degeneration. The remaining all the leiomyomas are  T2 hypointense and exhibit enhancement, suggesting viability. There is a pedunculated subserosal leiomyoma arising from the left frontal region measuring up to 1.9 x 2.4 cm.   The endometrium is slightly distorted but otherwise within normal limits. No focal endometrial lesion. Normal junctional zone.   The cervix and vagina are within normal limits. No focal mass. Vaginal pessary noted.   Bilateral ovaries are within normal limits. No focal adnexal or ovarian mass.   Other:  None.   Musculoskeletal: No suspicious bone lesions identified.   IMPRESSION: 1. Bulky and lobulated uterus secondary to multiple intramural and subserosal leiomyomas, as described in detail above. 2. Bilateral ovaries are within normal limits. No focal adnexal or ovarian mass.     Electronically Signed   By: Ree Molt M.D.   On: 02/10/2024 12:20   Labs:  CBC: No results for input(s): WBC, HGB, HCT, PLT in the last 8760 hours.  COAGS: No results for input(s): INR, APTT in the last 8760 hours.  BMP: No results for input(s): NA, K, CL, CO2, GLUCOSE, BUN, CALCIUM, CREATININE, GFRNONAA, GFRAA in the last 8760 hours.  Invalid input(s): CMP  LIVER FUNCTION TESTS: No results for input(s): BILITOT, AST, ALT, ALKPHOS, PROT, ALBUMIN in the last 8760 hours.  TUMOR MARKERS: No results for input(s): AFPTM, CEA, CA199, CHROMGRNA in the last 8760 hours.  Assessment and Plan:  39 year old female with a history of menorrhagia and anemia secondary to uterine leiomyomas.    Patient presents with mild/moderate severity symptoms (25 on symptom severity) that are mildly impacting her life (UFS-QoL of 78). Her main concerns are heavy bleeding during her cycle now with frequent breakthrough bleeding and associated anemia. Patient has already discussed surgical options with Dr. Rutherford and is not interested in invasive procedures at this time.   Discussed,  at length, the procedure of uterine fibroid embolization with the patient, including risks and benefits of the embolization including, but not limited to bleeding, infections, vascular injury, post-operative pain, or contrast induced renal injury. Patient verbalized understanding and would like to move forward with UAE procedure at this time.    1.) Labs drawn today 2/) Plan for UAE with hypogastric nerve block at preferred [facility] location at patient's earliest convenience     Thank you for this interesting consult.  I greatly enjoyed meeting Teresa Bean and look forward to participating in their care.  A copy of this report was sent to the requesting provider on this date.  Electronically Signed: Glennon CHRISTELLA Bal 03/12/2024, 1:57 PM   I spent a total  of 40 Minutes in face to face in clinical consultation, greater than 50% of which was counseling/coordinating care for leiomyoma with possible uterine artery embolization.  "

## 2024-03-12 ENCOUNTER — Other Ambulatory Visit (HOSPITAL_COMMUNITY): Payer: Self-pay | Admitting: Interventional Radiology

## 2024-03-12 ENCOUNTER — Inpatient Hospital Stay
Admission: RE | Admit: 2024-03-12 | Discharge: 2024-03-12 | Disposition: A | Source: Ambulatory Visit | Attending: Obstetrics and Gynecology | Admitting: Obstetrics and Gynecology

## 2024-03-12 VITALS — BP 125/77 | HR 78 | Temp 97.6°F | Resp 16 | Wt 180.0 lb

## 2024-03-12 DIAGNOSIS — D259 Leiomyoma of uterus, unspecified: Secondary | ICD-10-CM

## 2024-03-12 HISTORY — PX: IR RADIOLOGIST EVAL & MGMT: IMG5224

## 2024-03-13 LAB — CBC
HCT: 36.5 % (ref 35.9–46.0)
Hemoglobin: 11.2 g/dL — ABNORMAL LOW (ref 11.7–15.5)
MCH: 22.9 pg — ABNORMAL LOW (ref 27.0–33.0)
MCHC: 30.7 g/dL — ABNORMAL LOW (ref 31.6–35.4)
MCV: 74.5 fL — ABNORMAL LOW (ref 81.4–101.7)
MPV: 9.9 fL (ref 7.5–12.5)
Platelets: 449 Thousand/uL — ABNORMAL HIGH (ref 140–400)
RBC: 4.9 Million/uL (ref 3.80–5.10)
RDW: 17.1 % — ABNORMAL HIGH (ref 11.0–15.0)
WBC: 9 Thousand/uL (ref 3.8–10.8)

## 2024-03-13 LAB — COMPREHENSIVE METABOLIC PANEL WITH GFR
AG Ratio: 1.3 (calc) (ref 1.0–2.5)
ALT: 12 U/L (ref 6–29)
AST: 15 U/L (ref 10–30)
Albumin: 4.2 g/dL (ref 3.6–5.1)
Alkaline phosphatase (APISO): 54 U/L (ref 31–125)
BUN: 14 mg/dL (ref 7–25)
CO2: 27 mmol/L (ref 20–32)
Calcium: 10.1 mg/dL (ref 8.6–10.2)
Chloride: 105 mmol/L (ref 98–110)
Creat: 0.64 mg/dL (ref 0.50–0.97)
Globulin: 3.2 g/dL (ref 1.9–3.7)
Glucose, Bld: 120 mg/dL — ABNORMAL HIGH (ref 65–99)
Potassium: 4.1 mmol/L (ref 3.5–5.3)
Sodium: 138 mmol/L (ref 135–146)
Total Bilirubin: 0.3 mg/dL (ref 0.2–1.2)
Total Protein: 7.4 g/dL (ref 6.1–8.1)
eGFR: 116 mL/min/1.73m2

## 2024-03-13 LAB — CP4508-PT/INR AND PTT
INR: 1
Prothrombin Time: 10.6 s (ref 9.0–11.5)
aPTT: 27 s (ref 23–32)

## 2024-03-13 LAB — HOUSE ACCOUNT TRACKING

## 2024-03-14 ENCOUNTER — Other Ambulatory Visit: Payer: Self-pay | Admitting: Interventional Radiology

## 2024-03-14 DIAGNOSIS — D259 Leiomyoma of uterus, unspecified: Secondary | ICD-10-CM

## 2024-04-30 ENCOUNTER — Other Ambulatory Visit
